# Patient Record
Sex: Female | Born: 1970 | Race: White | Hispanic: No | Marital: Single | Smoking: Current every day smoker
Health system: Southern US, Community
[De-identification: ages and names within clinical notes are randomized; demographics above are authoritative.]

## PROBLEM LIST (undated history)

## (undated) HISTORY — PX: ABDOMINAL HYSTERECTOMY: SHX81

---

## 1999-09-21 ENCOUNTER — Encounter: Admission: RE | Admit: 1999-09-21 | Discharge: 1999-10-27 | Payer: Self-pay | Admitting: *Deleted

## 2007-02-12 ENCOUNTER — Ambulatory Visit: Payer: Self-pay | Admitting: Family Medicine

## 2007-02-27 ENCOUNTER — Ambulatory Visit: Payer: Self-pay | Admitting: Family Medicine

## 2007-03-14 ENCOUNTER — Ambulatory Visit: Payer: Self-pay | Admitting: Family Medicine

## 2007-04-06 ENCOUNTER — Ambulatory Visit: Payer: Self-pay | Admitting: Family Medicine

## 2007-04-09 ENCOUNTER — Encounter: Admission: RE | Admit: 2007-04-09 | Discharge: 2007-05-09 | Payer: Self-pay | Admitting: Family Medicine

## 2007-05-04 ENCOUNTER — Ambulatory Visit: Payer: Self-pay | Admitting: Family Medicine

## 2007-07-09 ENCOUNTER — Encounter: Admission: RE | Admit: 2007-07-09 | Discharge: 2007-07-17 | Payer: Self-pay | Admitting: Family Medicine

## 2013-10-09 ENCOUNTER — Encounter (HOSPITAL_COMMUNITY): Payer: Self-pay | Admitting: Emergency Medicine

## 2013-10-09 ENCOUNTER — Emergency Department (HOSPITAL_COMMUNITY)
Admission: EM | Admit: 2013-10-09 | Discharge: 2013-10-09 | Disposition: A | Discharge status: Home / Self Care | Payer: Self-pay | Attending: Emergency Medicine | Admitting: Emergency Medicine

## 2013-10-09 DIAGNOSIS — X58XXXA Exposure to other specified factors, initial encounter: Secondary | ICD-10-CM | POA: Insufficient documentation

## 2013-10-09 DIAGNOSIS — IMO0002 Reserved for concepts with insufficient information to code with codable children: Secondary | ICD-10-CM | POA: Insufficient documentation

## 2013-10-09 DIAGNOSIS — Y929 Unspecified place or not applicable: Secondary | ICD-10-CM | POA: Insufficient documentation

## 2013-10-09 DIAGNOSIS — F17200 Nicotine dependence, unspecified, uncomplicated: Secondary | ICD-10-CM | POA: Insufficient documentation

## 2013-10-09 DIAGNOSIS — Y93E8 Activity, other personal hygiene: Secondary | ICD-10-CM | POA: Insufficient documentation

## 2013-10-09 DIAGNOSIS — S00411A Abrasion of right ear, initial encounter: Secondary | ICD-10-CM

## 2013-10-09 MED ORDER — CIPROFLOXACIN-DEXAMETHASONE 0.3-0.1 % OT SUSP
4.0000 [drp] | Freq: Two times a day (BID) | OTIC | Status: AC
  Administered 2013-10-09: 4 [drp] via OTIC
  Filled 2013-10-09: qty 7.5

## 2013-10-09 MED ORDER — CIPROFLOXACIN-DEXAMETHASONE 0.3-0.1 % OT SUSP
OTIC | Status: AC
  Filled 2013-10-09: qty 7.5

## 2013-10-09 NOTE — ED Provider Notes (Signed)
CSN: [id number]     Arrival date & time 10/09/13  0555 History   First MD Initiated Contact with Patient 10/09/13 0612     Chief Complaint  Patient presents with  . Foreign Body in Ear   (Consider location/radiation/quality/duration/timing/severity/associated sxs/prior Treatment) HPI Comments: 42 year old female who believes that she has a small amount of cotton in her right ear. She was RAC at another hospital where she states that a nurse tried to remove the foreign body from her ear and caused pain but no bleeding. This occurred last night, the symptoms are mild, persistent, not associated with vertigo or tinnitus.  Patient is a 42 y.o. female presenting with foreign body in ear. The history is provided by the patient.  Foreign Body in Ear    History reviewed. No pertinent past medical history. Past Surgical History  Procedure Laterality Date  . Abdominal hysterectomy     History reviewed. No pertinent family history. History  Substance Use Topics  . Smoking status: Current Every Day Smoker  . Smokeless tobacco: Not on file  . Alcohol Use: Yes     Comment: occasionally   OB History   Grav Para Term Preterm Abortions TAB SAB Ect Mult Living                 Review of Systems  Constitutional: Negative for fever.  HENT: Positive for ear pain.     Allergies  Review of patient's allergies indicates no known allergies.  Home Medications  No current outpatient prescriptions on file. BP 134/88  Pulse 90  Temp(Src) 98.3 F (36.8 C) (Oral)  Resp 16  Ht 5' (1.524 m)  Wt 170 lb (77.111 kg)  BMI 33.20 kg/m2  SpO2 98% Physical Exam  Constitutional: She appears well-developed and well-nourished. No distress.  HENT:  Bilateral tympanic membranes are clear, no foreign bodies in either of the external auditory canals, right external auditory canal with a very small abrasion  Eyes: Conjunctivae are normal. Right eye exhibits no discharge. Left eye exhibits no discharge.   Neck: Normal range of motion. Neck supple.  Pulmonary/Chest: Effort normal.  Neurological: She is alert. Coordination normal.  Skin: Skin is warm and dry.    ED Course  Procedures (including critical care time) Labs Review Labs Reviewed - No data to display Imaging Review No results found.  EKG Interpretation   None       MDM   1. Abrasion of ear canal, right, initial encounter    No foreign body seen, has a very small abrasion, will use topical antibiotics, home, followup as needed.  Meds given in ED:  Medications  ciprofloxacin-dexamethasone (CIPRODEX) 0.3-0.1 % otic suspension 4 drop (not administered)    New Prescriptions   No medications on file      Vida Roller, MD 10/09/13 [phone]

## 2013-10-09 NOTE — ED Notes (Signed)
Patient states she was cleaning her ears with a q-tip and the cotton came off in her right ear.

## 2014-09-16 ENCOUNTER — Emergency Department (HOSPITAL_COMMUNITY): Payer: Self-pay

## 2014-09-16 ENCOUNTER — Encounter (HOSPITAL_COMMUNITY): Payer: Self-pay | Admitting: Emergency Medicine

## 2014-09-16 ENCOUNTER — Emergency Department (HOSPITAL_COMMUNITY)
Admission: EM | Admit: 2014-09-16 | Discharge: 2014-09-16 | Disposition: A | Discharge status: Home / Self Care | Payer: Self-pay | Attending: Emergency Medicine | Admitting: Emergency Medicine

## 2014-09-16 DIAGNOSIS — Y929 Unspecified place or not applicable: Secondary | ICD-10-CM | POA: Insufficient documentation

## 2014-09-16 DIAGNOSIS — W010XXA Fall on same level from slipping, tripping and stumbling without subsequent striking against object, initial encounter: Secondary | ICD-10-CM | POA: Insufficient documentation

## 2014-09-16 DIAGNOSIS — S8392XA Sprain of unspecified site of left knee, initial encounter: Secondary | ICD-10-CM | POA: Insufficient documentation

## 2014-09-16 DIAGNOSIS — Z720 Tobacco use: Secondary | ICD-10-CM | POA: Insufficient documentation

## 2014-09-16 DIAGNOSIS — Y939 Activity, unspecified: Secondary | ICD-10-CM | POA: Insufficient documentation

## 2014-09-16 MED ORDER — OXYCODONE-ACETAMINOPHEN 5-325 MG PO TABS
1.0000 | ORAL_TABLET | Freq: Once | ORAL | Status: AC
  Administered 2014-09-16: 1 via ORAL
  Filled 2014-09-16: qty 1

## 2014-09-16 MED ORDER — OXYCODONE-ACETAMINOPHEN 5-325 MG PO TABS: 1.0000 | ORAL_TABLET | Freq: Four times a day (QID) | ORAL | Status: DC | PRN

## 2014-09-16 NOTE — ED Notes (Signed)
Pain lt knee, slipped in water and fell

## 2014-09-16 NOTE — ED Provider Notes (Signed)
CSN: [id number]     Arrival date & time 09/16/14  2210 History  This chart was scribed for American Expressathan R. Rubin PayorPickering, MD by Murriel HopperAlec Bankhead, ED Scribe. This patient was seen in room [facility] and the patient's care was started at 10:17 PM.    Chief Complaint  Patient presents with  . Knee Pain      The history is provided by the patient. No language interpreter was used.    HPI Comments: Paula Blackwell is a 43 y.o. female who presents to the Emergency Department complaining of moderate constant knee pain that began after a fall earlier this evening. Pt reports slipping and falling on a slippery surface and landing on her left knee. She states that she also twisted her leg. She denies acute hip pain or foot pain. No alleviating or modifying factors noted.   History reviewed. No pertinent past medical history. Past Surgical History  Procedure Laterality Date  . Abdominal hysterectomy     History reviewed. No pertinent family history. History  Substance Use Topics  . Smoking status: Current Every Day Smoker  . Smokeless tobacco: Not on file  . Alcohol Use: No     Comment: occasionally   OB History   Grav Para Term Preterm Abortions TAB SAB Ect Mult Living                 Review of Systems  Musculoskeletal: Positive for joint swelling.  All other systems reviewed and are negative.     Allergies  Review of patient's allergies indicates no known allergies.  Home Medications   Prior to Admission medications   Medication Sig Start Date End Date Taking? Authorizing Provider  acetaminophen (TYLENOL) 500 MG tablet Take 1,000 mg by mouth every 6 (six) hours as needed for mild pain or moderate pain.   Yes Historical Provider, MD  ibuprofen (ADVIL,MOTRIN) 200 MG tablet Take 600 mg by mouth every 6 (six) hours as needed for mild pain.   Yes Historical Provider, MD  oxyCODONE-acetaminophen (PERCOCET/ROXICET) 5-325 MG per tablet Take 1-2 tablets by mouth every 6 (six) hours as needed  for severe pain. 09/16/14   Juliet RudeNathan R. Catherin Doorn, MD   BP 143/84  Pulse 90  Temp(Src) 98.3 F (36.8 C) (Oral)  Ht 5' (1.524 m)  Wt 178 lb (80.74 kg)  BMI 34.76 kg/m2  SpO2 98% Physical Exam  Constitutional: She is oriented to person, place, and time. She appears well-developed and well-nourished. No distress.  HENT:  Head: Normocephalic.  Eyes: Conjunctivae are normal. Pupils are equal, round, and reactive to light. No scleral icterus.  Neck: Normal range of motion. Neck supple. No thyromegaly present.  Cardiovascular: Normal rate and regular rhythm.  Exam reveals no gallop and no friction rub.   No murmur heard. Pulmonary/Chest: Effort normal and breath sounds normal. No respiratory distress. She has no wheezes. She has no rales.  Abdominal: Soft. Bowel sounds are normal. She exhibits no distension. There is no tenderness. There is no rebound.  Musculoskeletal: Normal range of motion. She exhibits edema and tenderness.  No tenderness over right hip Tenderness and crepitus over superior patella  Good movement at foot and ankle with possible effusion of the left knee Good strength of LLE  Neurological: She is alert and oriented to person, place, and time.  Skin: Skin is warm and dry. No rash noted.  Psychiatric: She has a normal mood and affect. Her behavior is normal.     ED Course  Procedures  DIAGNOSTIC STUDIES: Oxygen Saturation is 98% on RA, normal by my interpretation.    COORDINATION OF CARE: 10:18 PM Discussed treatment plan with pt at bedside and pt agreed to plan.   Labs Review Labs Reviewed - No data to display  Imaging Review Dg Knee Complete 4 Views Left  09/16/2014   CLINICAL DATA:  Moderate constant left knee pain at the severely at the patella beginning after a fall today around noon.  EXAM: LEFT KNEE - COMPLETE 4+ VIEW  COMPARISON:  None.  FINDINGS: There is no evidence of fracture, dislocation, or joint effusion. There is no evidence of arthropathy or  other focal bone abnormality. Soft tissues are unremarkable.  IMPRESSION: Negative.   Electronically Signed   By: Burman NievesWilliam  Stevens M.D.   On: 09/16/2014 23:06     EKG Interpretation None      MDM   Final diagnoses:  Knee sprain, left, initial encounter    Patient with slipping on wet ground with knee pain. Tenderness. Possible effusion. Negative x-ray. Will place a knee immobilizer and have followup with Ortho as needed  I personally performed the services described in this documentation, which was scribed in my presence. The recorded information has been reviewed and is accurate.     [name], MD 09/16/14 [phone]

## 2014-09-16 NOTE — Discharge Instructions (Signed)

## 2015-02-26 IMAGING — CR DG KNEE COMPLETE 4+V*L*
4 series · 4 of 4 positions shown · non-contrast
Comparison: None.

CLINICAL DATA: Moderate constant left knee pain at the severely at
the patella beginning after a fall today around noon.

EXAM:
LEFT KNEE - COMPLETE 4+ VIEW

[view not recorded (1 of 4)]
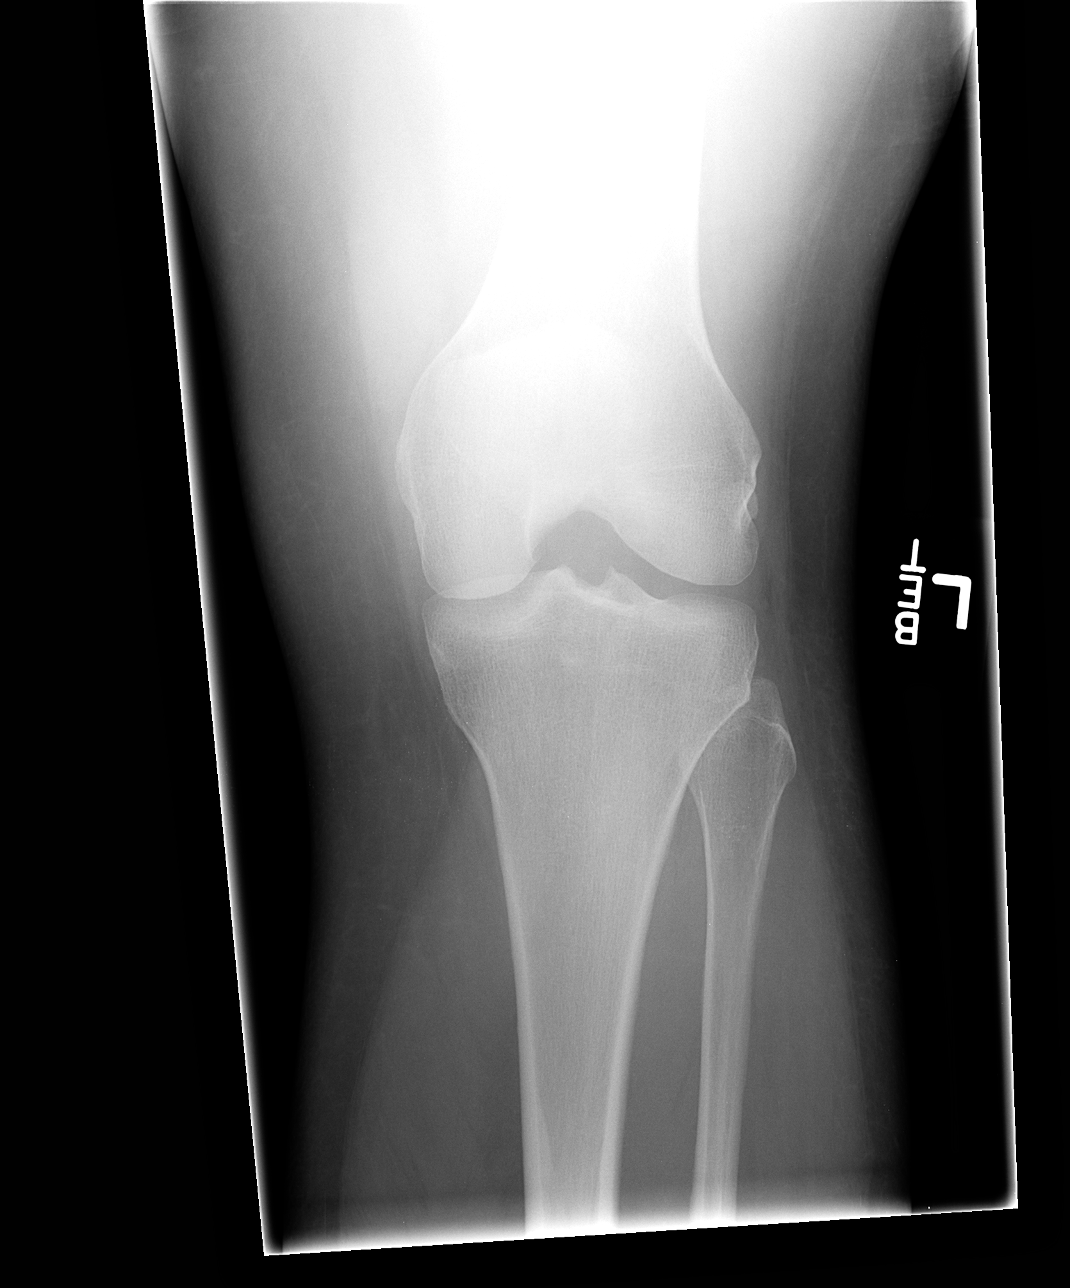

[view not recorded (2 of 4)]
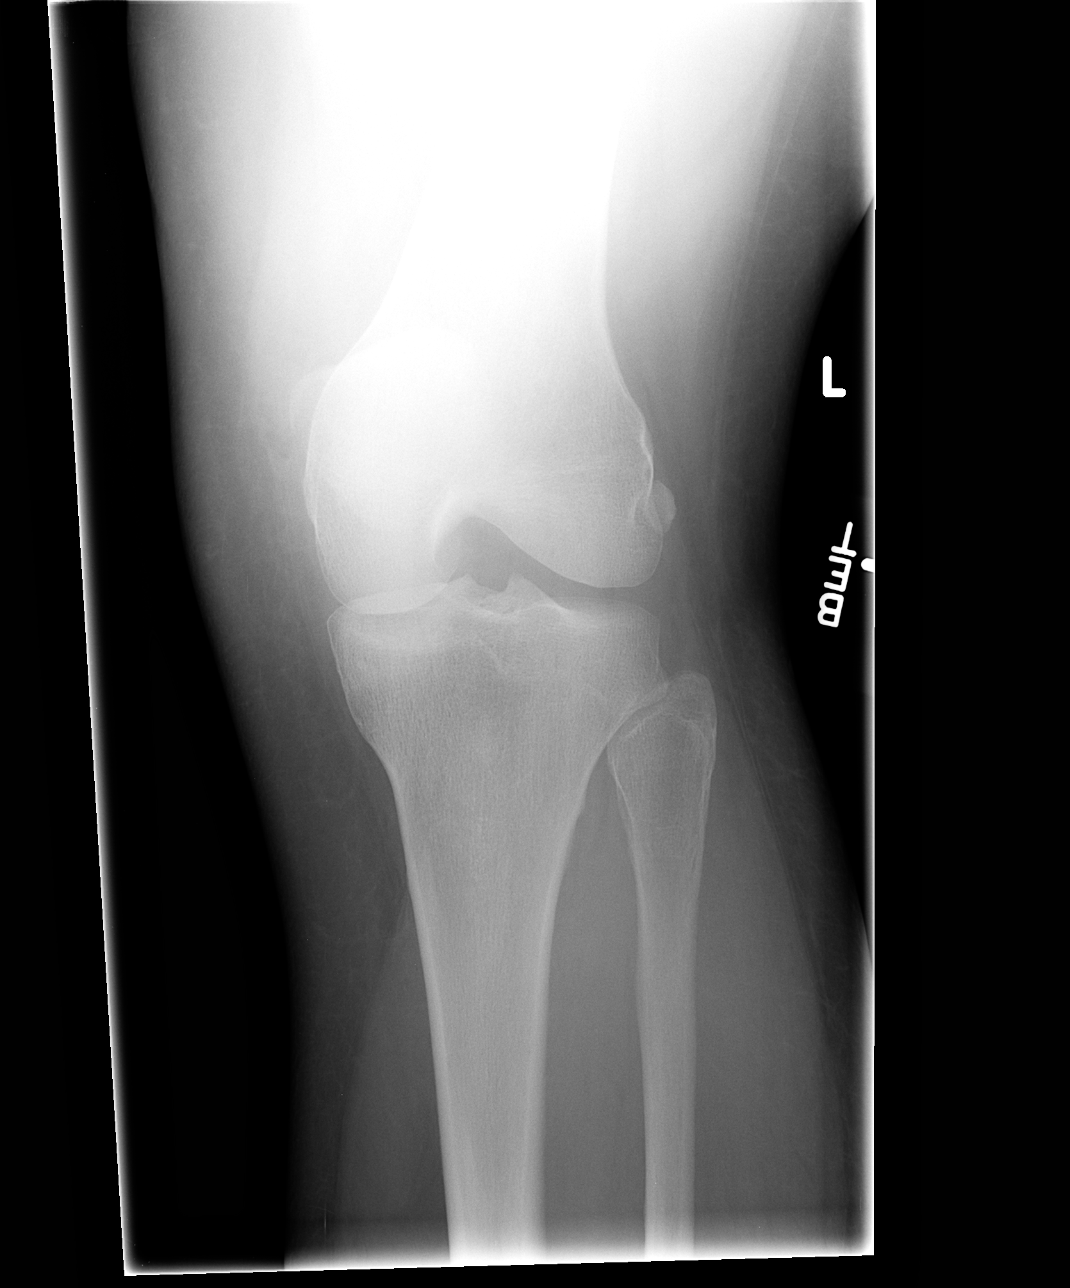

[view not recorded (3 of 4)]
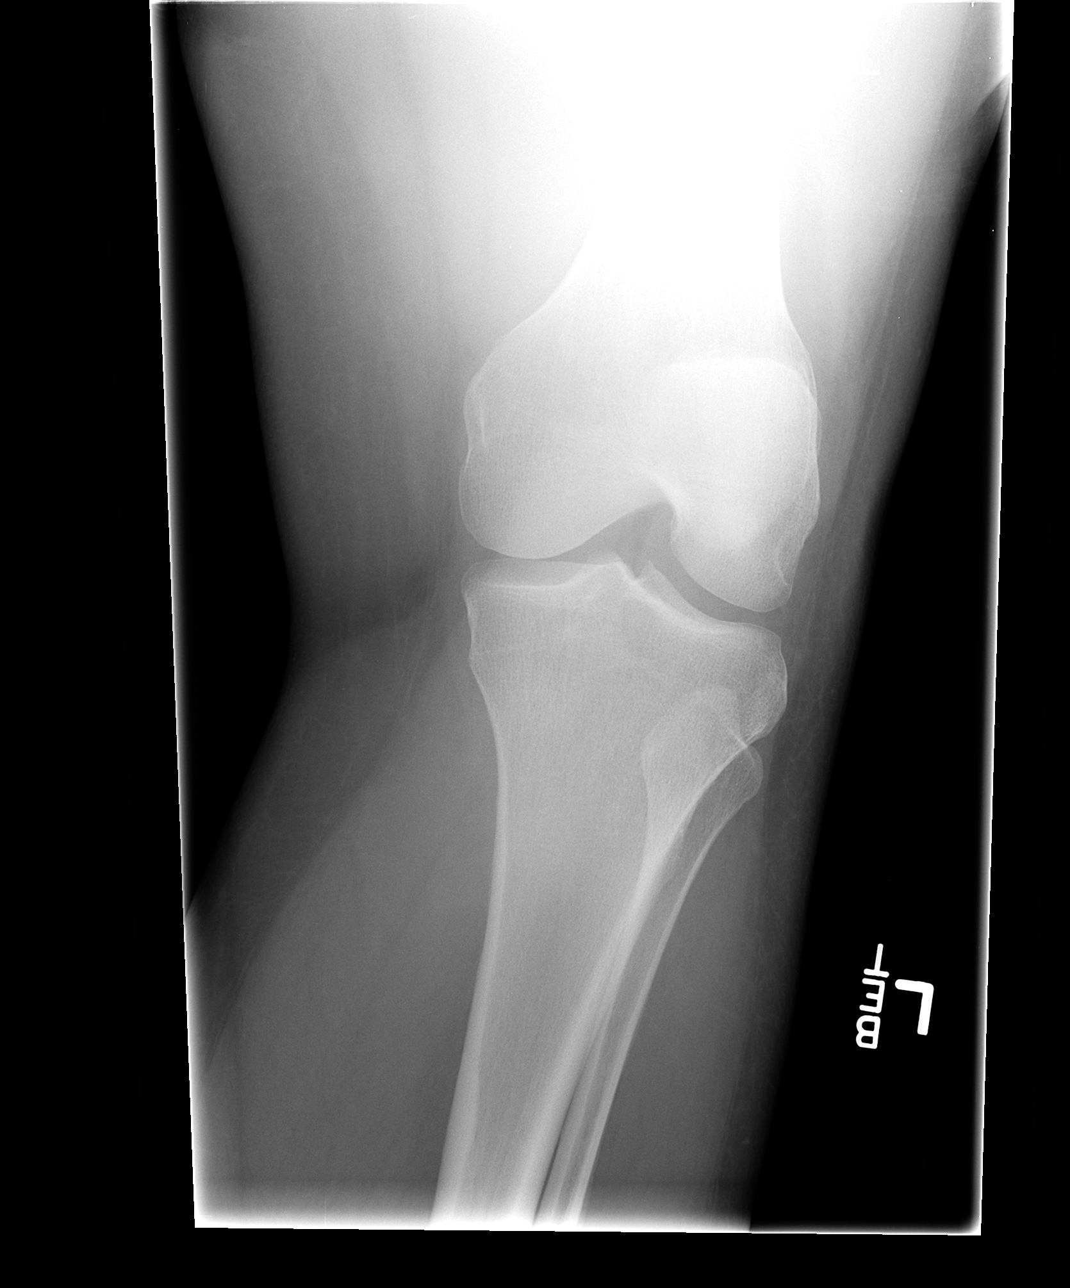

[view not recorded (4 of 4)]
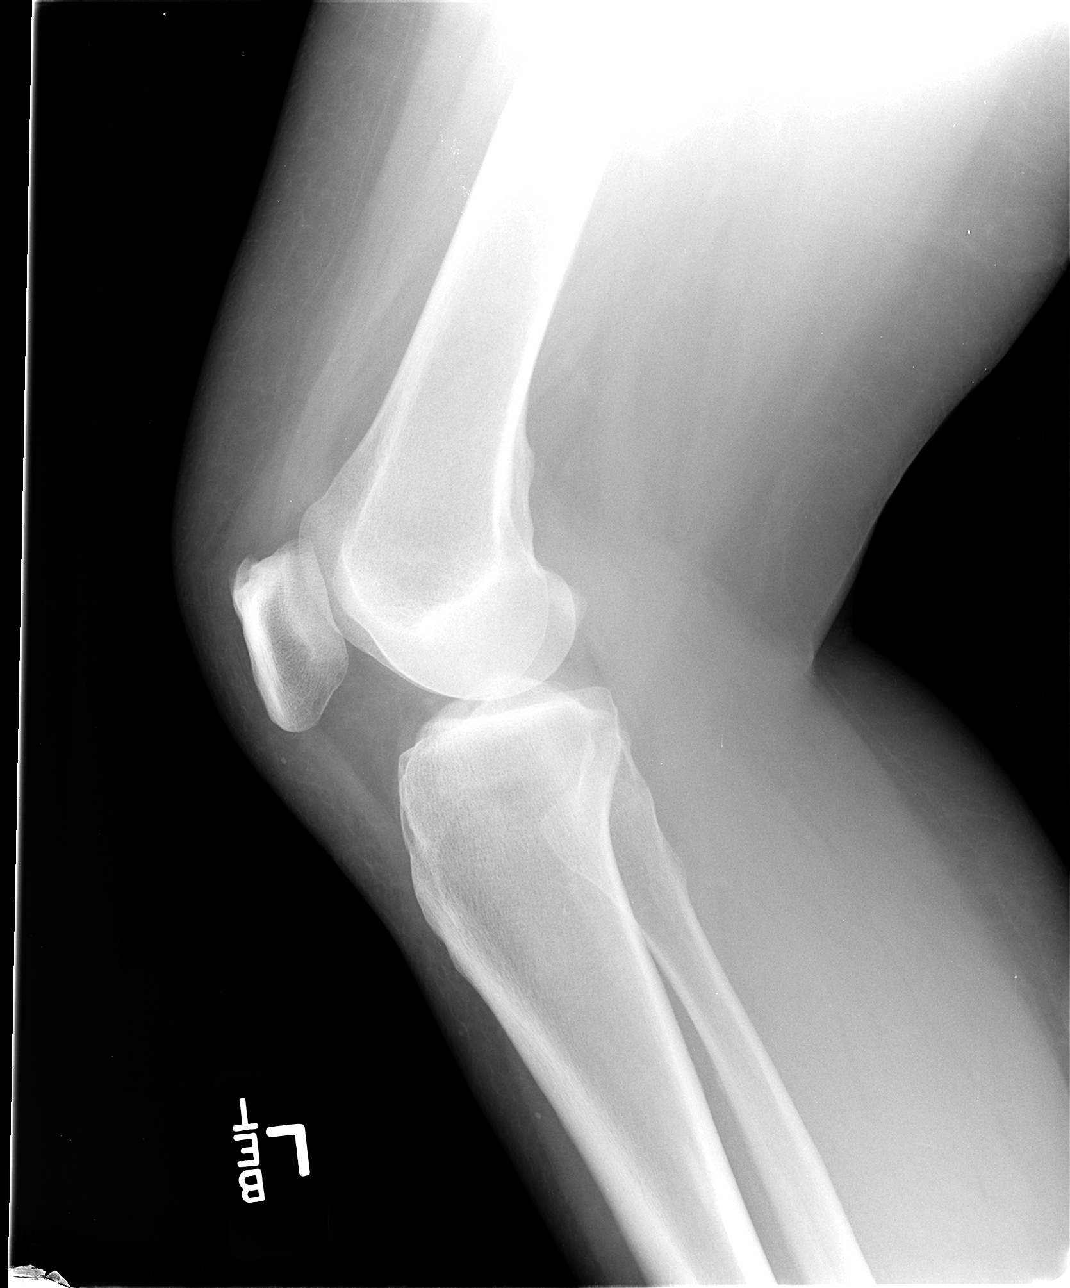

[4 of 4 positions shown; findings below may reference images not displayed]

FINDINGS: There is no evidence of fracture, dislocation, or joint effusion.
There is no evidence of arthropathy or other focal bone abnormality.
Soft tissues are unremarkable.
IMPRESSION: Negative.

## 2016-11-25 ENCOUNTER — Encounter (HOSPITAL_COMMUNITY): Payer: Self-pay | Admitting: Emergency Medicine

## 2016-11-25 ENCOUNTER — Emergency Department (HOSPITAL_COMMUNITY): Payer: Self-pay

## 2016-11-25 ENCOUNTER — Emergency Department (HOSPITAL_COMMUNITY)
Admission: EM | Admit: 2016-11-25 | Discharge: 2016-11-25 | Disposition: A | Discharge status: Home / Self Care | Payer: Self-pay | Attending: Emergency Medicine | Admitting: Emergency Medicine

## 2016-11-25 DIAGNOSIS — J40 Bronchitis, not specified as acute or chronic: Secondary | ICD-10-CM | POA: Insufficient documentation

## 2016-11-25 DIAGNOSIS — F17210 Nicotine dependence, cigarettes, uncomplicated: Secondary | ICD-10-CM | POA: Insufficient documentation

## 2016-11-25 LAB — COMPREHENSIVE METABOLIC PANEL
ALT: 22 U/L (ref 14–54)
AST: 20 U/L (ref 15–41)
Albumin: 4.1 g/dL (ref 3.5–5.0)
Alkaline Phosphatase: 98 U/L (ref 38–126)
Anion gap: 5 (ref 5–15)
BUN: 13 mg/dL (ref 6–20)
CHLORIDE: 101 mmol/L (ref 101–111)
CO2: 28 mmol/L (ref 22–32)
Calcium: 8.6 mg/dL — ABNORMAL LOW (ref 8.9–10.3)
Creatinine, Ser: 0.59 mg/dL (ref 0.44–1.00)
Glucose, Bld: 102 mg/dL — ABNORMAL HIGH (ref 65–99)
POTASSIUM: 3.8 mmol/L (ref 3.5–5.1)
SODIUM: 134 mmol/L — AB (ref 135–145)
Total Bilirubin: 0.5 mg/dL (ref 0.3–1.2)
Total Protein: 7.5 g/dL (ref 6.5–8.1)

## 2016-11-25 LAB — TROPONIN I: Troponin I: <0.03 ng/mL (ref <0.03)

## 2016-11-25 LAB — CBC
HEMATOCRIT: 42.2 % (ref 36.0–46.0)
HEMOGLOBIN: 14.2 g/dL (ref 12.0–15.0)
MCH: 31.8 pg (ref 26.0–34.0)
MCHC: 33.6 g/dL (ref 30.0–36.0)
MCV: 94.6 fL (ref 78.0–100.0)
Platelets: 302 10*3/uL (ref 150–400)
RBC: 4.46 MIL/uL (ref 3.87–5.11)
RDW: 13.1 % (ref 11.5–15.5)
WBC: 5.3 10*3/uL (ref 4.0–10.5)

## 2016-11-25 MED ORDER — PREDNISONE 20 MG PO TABS: ORAL_TABLET | ORAL | 0 refills | Status: AC

## 2016-11-25 MED ORDER — PREDNISONE 20 MG PO TABS
40.0000 mg | ORAL_TABLET | Freq: Once | ORAL | Status: AC
  Administered 2016-11-25: 40 mg via ORAL
  Filled 2016-11-25: qty 2

## 2016-11-25 MED ORDER — ALBUTEROL SULFATE HFA 108 (90 BASE) MCG/ACT IN AERS
2.0000 | INHALATION_SPRAY | Freq: Once | RESPIRATORY_TRACT | Status: AC
  Administered 2016-11-25: 2 via RESPIRATORY_TRACT
  Filled 2016-11-25: qty 6.7

## 2016-11-25 MED ORDER — HYDROCODONE-HOMATROPINE 5-1.5 MG/5ML PO SYRP: 5.0000 mL | ORAL_SOLUTION | Freq: Two times a day (BID) | ORAL | 0 refills | Status: AC | PRN

## 2016-11-25 NOTE — ED Provider Notes (Signed)
[facility]-EMER[facility]ENCY DEPT Provider Note   CSN: [id number] Arrival date & time: 11/25/16  1632     History   Chief Complaint Chief Complaint  Patient presents with  . Chest Pain    HPI Paula Blackwell is a 45 y.o. female.   Chest Pain   This is a new problem. The current episode started 2 days ago. The problem occurs constantly. The problem has not changed since onset.The pain is associated with coughing and breathing. The pain is present in the lateral region. The pain is mild. The quality of the pain is described as sharp and stabbing. The pain does not radiate. The symptoms are aggravated by deep breathing. Associated symptoms include cough. Pertinent negatives include no fever and no nausea.    History reviewed. No pertinent past medical history.  There are no active problems to display for this patient.   Past Surgical History:  Procedure Laterality Date  . ABDOMINAL HYSTERECTOMY      OB History    No data available       Home Medications    Prior to Admission medications   Medication Sig Start Date End Date Taking? Authorizing Provider  ibuprofen (ADVIL,MOTRIN) 200 MG tablet Take 600 mg by mouth every 6 (six) hours as needed for mild pain.   Yes Historical Provider, MD  Pseudoephedrine-APAP-DM (DAYQUIL MULTI-SYMPTOM COLD/FLU PO) Take 15 mLs by mouth daily as needed (for symptoms).   Yes Historical Provider, MD  HYDROcodone-homatropine (HYCODAN) 5-1.5 MG/5ML syrup Take 5 mLs by mouth every 12 (twelve) hours as needed for cough. 11/25/16   Marily MemosJason Courtlyn Aki, MD  predniSONE (DELTASONE) 20 MG tablet 2 tabs po daily x 4 days 11/26/16   Marily MemosJason Farhad Burleson, MD    Family History No family history on file.  Social History Social History  Substance Use Topics  . Smoking status: Current Every Day Smoker    Packs/day: 0.50    Types: Cigarettes  . Smokeless tobacco: Never Used  . Alcohol use No     Comment: occasionally     Allergies   Patient has no known  allergies.   Review of Systems Review of Systems  Constitutional: Negative for fever.  Eyes: Negative for pain.  Respiratory: Positive for cough.   Cardiovascular: Positive for chest pain.  Gastrointestinal: Negative for nausea.  All other systems reviewed and are negative.    Physical Exam Updated Vital Signs BP 136/77   Pulse 79   Temp 98.2 F (36.8 C) (Oral)   Resp 22   Ht 5' (1.524 m)   Wt 160 lb (72.6 kg)   SpO2 95%   BMI 31.25 kg/m   Physical Exam  Constitutional: She is oriented to person, place, and time. She appears well-developed and well-nourished.  HENT:  Head: Normocephalic and atraumatic.  Eyes: Conjunctivae and EOM are normal.  Neck: Normal range of motion.  Cardiovascular: Normal rate and regular rhythm.   Pulmonary/Chest: No accessory muscle usage or stridor. No tachypnea. No respiratory distress. She has decreased breath sounds. She has no wheezes. She has no rhonchi.  Abdominal: Soft. She exhibits no distension.  Musculoskeletal: Normal range of motion. She exhibits no edema or deformity.  Neurological: She is alert and oriented to person, place, and time. No cranial nerve deficit.  Skin: Skin is warm and dry.  Nursing note and vitals reviewed.    ED Treatments / Results  Labs (all labs ordered are listed, but only abnormal results are displayed) Labs Reviewed  COMPREHENSIVE METABOLIC PANEL -  Abnormal; Notable for the following:       Result Value   Sodium 134 (*)    Glucose, Bld 102 (*)    Calcium 8.6 (*)    All other components within normal limits  TROPONIN I  CBC  TROPONIN I    EKG  EKG Interpretation None       Radiology Dg Chest 2 View  Result Date: 11/25/2016 CLINICAL DATA:  Progressive right-sided chest pain. Shortness of breath. EXAM: CHEST  2 VIEW COMPARISON:  None. FINDINGS: The heart size and mediastinal contours are within normal limits. Both lungs are clear. The visualized skeletal structures are unremarkable.  IMPRESSION: Normal chest. Electronically Signed   By: Francene BoyersJames  Maxwell M.D.   On: 11/25/2016 17:29    Procedures Procedures (including critical care time)  Medications Ordered in ED Medications  albuterol (PROVENTIL HFA;VENTOLIN HFA) 108 (90 Base) MCG/ACT inhaler 2 puff (2 puffs Inhalation Given 11/25/16 2002)  predniSONE (DELTASONE) tablet 40 mg (40 mg Oral Given 11/25/16 1959)     Initial Impression / Assessment and Plan / ED Course  I have reviewed the triage vital signs and the nursing notes.  Pertinent labs & imaging results that were available during my care of the patient were reviewed by me and considered in my medical decision making (see chart for details).  Clinical Course    Likely bronchitis.  No RF, aside from smoking, for ACS, atypical story. Low HEART, will delta trop with serial ecg's.  PERC negative.  Cxr, PE and history not c/w pneumonia.  Will tx w/ inhaler/steroids/cough meds.  Delta trops negative. Symptomatic improvement. Will dc on albuterol/steroids/cough meds  Final Clinical Impressions(s) / ED Diagnoses   Final diagnoses:  Bronchitis    New Prescriptions New Prescriptions   HYDROCODONE-HOMATROPINE (HYCODAN) 5-1.5 MG/5ML SYRUP    Take 5 mLs by mouth every 12 (twelve) hours as needed for cough.   PREDNISONE (DELTASONE) 20 MG TABLET    2 tabs po daily x 4 days     Marily MemosJason Junell Cullifer, MD 11/26/16 [phone]

## 2016-11-25 NOTE — ED Notes (Signed)
EKG givne to Dr. Clayborne DanaMesner.

## 2016-11-25 NOTE — ED Notes (Signed)
Pt is a cigarette smoker with chest tightness and URI symptoms for several days. She has tried OTC meds with out relief

## 2016-11-25 NOTE — ED Triage Notes (Signed)
Onset 2 days ago, chest pain worse with deep breath,

## 2016-11-25 NOTE — ED Notes (Signed)
Physician in to assess 

## 2017-05-07 IMAGING — DX DG CHEST 2V
2 series · 2 of 2 positions shown · non-contrast
Comparison: None.

CLINICAL DATA: Progressive right-sided chest pain. Shortness of
breath.

EXAM:
CHEST  2 VIEW

[chest pa]
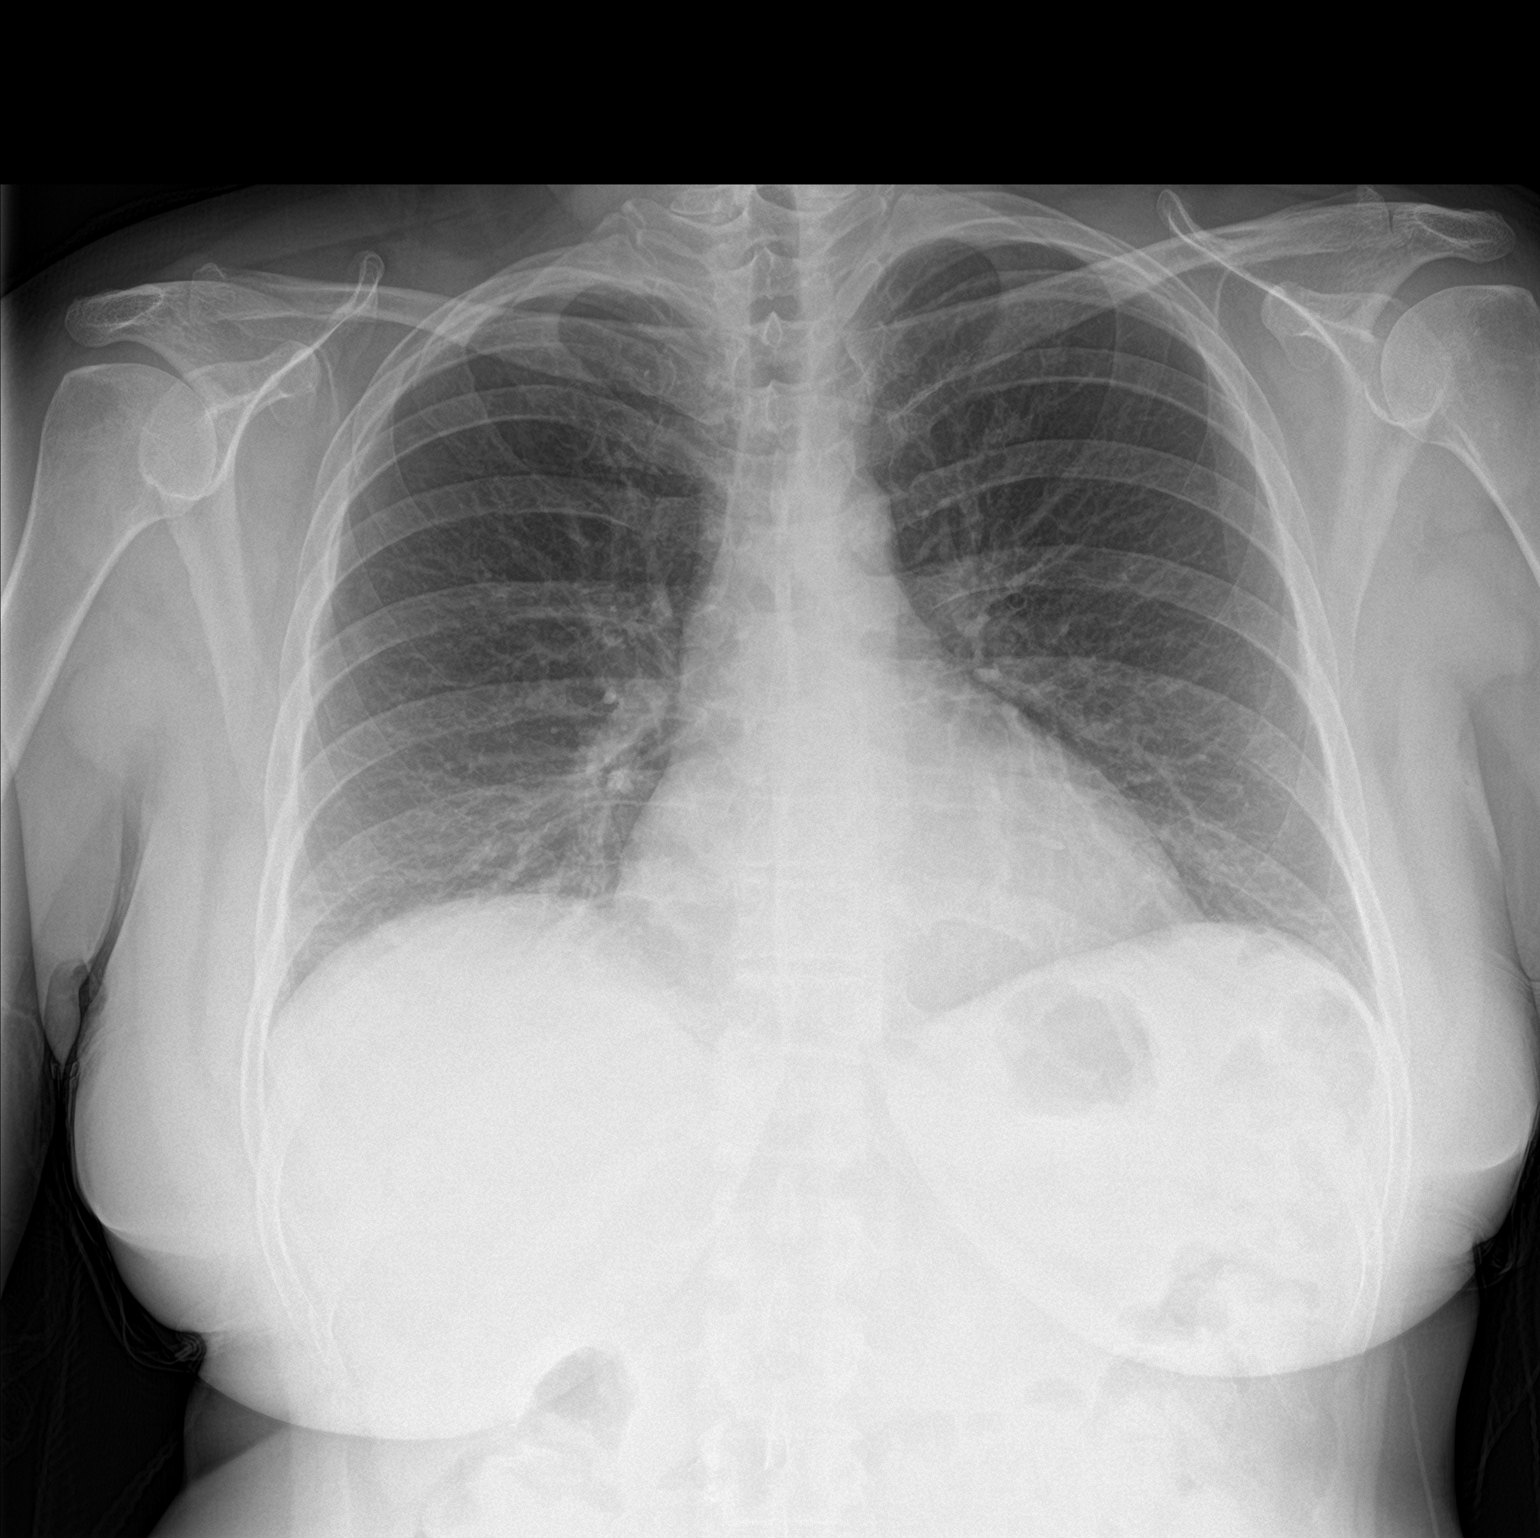

[chest lat]
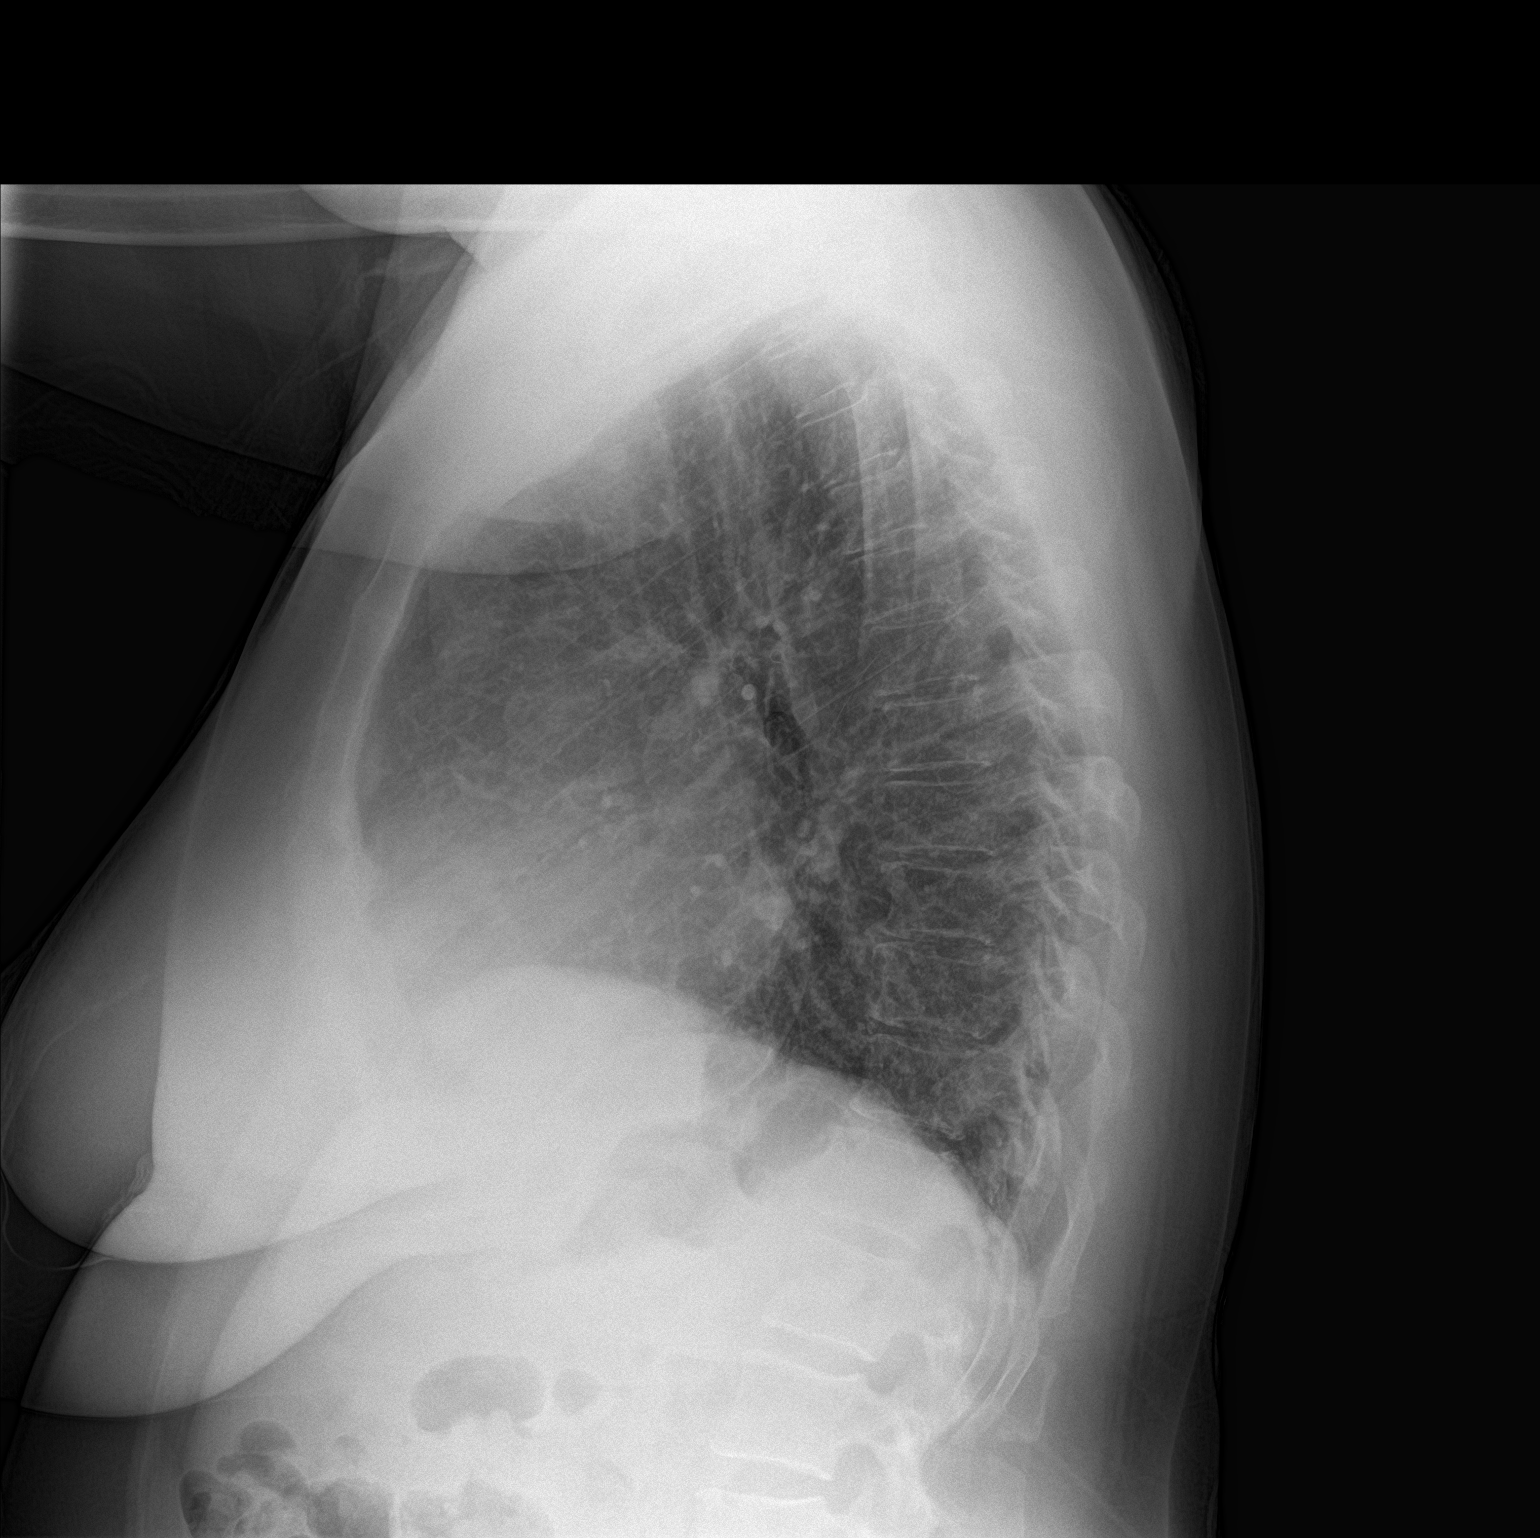

[2 of 2 positions shown; findings below may reference images not displayed]

FINDINGS: The heart size and mediastinal contours are within normal limits.
Both lungs are clear. The visualized skeletal structures are
unremarkable.
IMPRESSION: Normal chest.
